# Patient Record
Sex: Male | Born: 1980 | Race: White | Hispanic: No | Marital: Married | Smoking: Never smoker
Health system: Southern US, Community
[De-identification: ages and names within clinical notes are randomized; demographics above are authoritative.]

## PROBLEM LIST (undated history)

## (undated) DIAGNOSIS — J45909 Unspecified asthma, uncomplicated: Secondary | ICD-10-CM

## (undated) HISTORY — PX: OTHER SURGICAL HISTORY: SHX169

## (undated) HISTORY — PX: TONSILLECTOMY: SUR1361

---

## 2009-11-02 ENCOUNTER — Ambulatory Visit: Payer: Self-pay | Admitting: Family Medicine

## 2009-11-02 DIAGNOSIS — J45909 Unspecified asthma, uncomplicated: Secondary | ICD-10-CM | POA: Insufficient documentation

## 2009-11-02 DIAGNOSIS — M26629 Arthralgia of temporomandibular joint, unspecified side: Secondary | ICD-10-CM

## 2009-11-04 ENCOUNTER — Encounter: Payer: Self-pay | Admitting: Family Medicine

## 2010-09-02 NOTE — Letter (Signed)
Summary: Internal Correspondence  Internal Correspondence   Imported By: Dannette Barbara 11/04/2009 11:09:46  _____________________________________________________________________  External Attachment:    Type:   Image     Comment:   External Document

## 2010-09-02 NOTE — Assessment & Plan Note (Signed)
Summary: Ear pain- Both x 1wk rm 3   Vital Signs:  Patient Profile:   30 Years Old Male CC:      Cold & URI symptoms Height:     72 inches Weight:      234 pounds O2 Sat:      100 % O2 treatment:    Room Air Temp:     96.6 degrees F oral Pulse rate:   75 / minute Pulse rhythm:   regular Resp:     16 per minute BP sitting:   142 / 86  (right arm) Cuff size:   regular  Vitals Entered By: Areta Haber CMA (November 02, 2009 5:00 PM)                  Current Allergies: No known allergies History of Present Illness Chief Complaint: Cold & URI symptoms History of Present Illness: Subjective:  Patient complains of two history of vague right earache, and now the left.  He notes that he has had some sinus congestion and ? sore throat.  No fever. No cough.  He denies grinding his teeth.  No teeth pain  Current Problems: TMJ PAIN (ICD-524.62) ASTHMA (ICD-493.90)   Current Meds CLARITIN 10 MG TABS (LORATADINE) 1 tab by mouth once daily NAPROXEN 500 MG TABS (NAPROXEN) One by mouth two times a day pc CYCLOBENZAPRINE HCL 10 MG TABS (CYCLOBENZAPRINE HCL) One tab by mouth HS  REVIEW OF SYSTEMS Constitutional Symptoms      Denies fever, chills, night sweats, weight loss, weight gain, and fatigue.  Eyes       Denies change in vision, eye pain, eye discharge, glasses, contact lenses, and eye surgery. Ear/Nose/Throat/Mouth       Complains of ear pain.      Denies hearing loss/aids, change in hearing, ear discharge, dizziness, frequent runny nose, frequent nose bleeds, sinus problems, sore throat, hoarseness, and tooth pain or bleeding.      Comments: Both x 1 wk Respiratory       Denies dry cough, productive cough, wheezing, shortness of breath, asthma, bronchitis, and emphysema/COPD.  Cardiovascular       Denies murmurs, chest pain, and tires easily with exhertion.    Gastrointestinal       Denies stomach pain, nausea/vomiting, diarrhea, constipation, blood in bowel movements, and  indigestion. Genitourniary       Denies painful urination, kidney stones, and loss of urinary control. Neurological       Denies paralysis, seizures, and fainting/blackouts. Musculoskeletal       Denies muscle pain, joint pain, joint stiffness, decreased range of motion, redness, swelling, muscle weakness, and gout.  Skin       Denies bruising, unusual mles/lumps or sores, and hair/skin or nail changes.  Psych       Denies mood changes, temper/anger issues, anxiety/stress, speech problems, depression, and sleep problems. Other Comments: Pt has not seen PCP for this. Pt states that he has been out of the country.   Past History:  Past Medical History: Asthma  Past Surgical History: Denies surgical history  Social History: Married Never Smoked Alcohol use-no Drug use-no Regular exercise-no Smoking Status:  never Drug Use:  no Does Patient Exercise:  no   Objective:  No acute distress  Eyes:  Pupils are equal, round, and reactive to light and accomdation.  Extraocular movement is intact.  Conjunctivae are not inflamed.  Ears:  Canals normal.  Tympanic membranes normal.   There is distinct tenderness over  both TMJ's worse on the right. Nose:  Minimal congestion.  No sinus tenderness Mouth:  Normal Pharynx:  Normal  Neck:  Supple.  No adenopathy is present.  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  Begin jaw exercises.  Apply ice pack 2 or 3 times daily.  Given [employer] on TMJ problems Follow-up with ENT if not improving.     Diagnoses and expected course of recovery discussed and will return if not improved as expected or if the condition worsens. Patient and/or caregiver verbalized understanding.  Prescriptions: CYCLOBENZAPRINE HCL 10 MG TABS (CYCLOBENZAPRINE HCL) One tab by mouth HS  #15 x 1   Entered and Authorized by:   Donna Christen MD   Signed by:   Donna Christen MD on 11/02/2009   Method used:   Print then Give to Patient   RxID:   [id number] NAPROXEN 500 MG TABS (NAPROXEN) One by mouth two times a day pc  #30 x 1   Entered and Authorized by:   Donna Christen MD   Signed by:   Donna Christen MD on 11/02/2009   Method used:   Print then Give to Patient   RxID:   [id number]

## 2014-04-01 ENCOUNTER — Emergency Department (HOSPITAL_BASED_OUTPATIENT_CLINIC_OR_DEPARTMENT_OTHER)
Admission: EM | Admit: 2014-04-01 | Discharge: 2014-04-02 | Disposition: A | Discharge status: Home / Self Care | Payer: BC Managed Care – PPO | Attending: Emergency Medicine | Admitting: Emergency Medicine

## 2014-04-01 ENCOUNTER — Emergency Department (HOSPITAL_BASED_OUTPATIENT_CLINIC_OR_DEPARTMENT_OTHER): Payer: BC Managed Care – PPO

## 2014-04-01 ENCOUNTER — Encounter (HOSPITAL_BASED_OUTPATIENT_CLINIC_OR_DEPARTMENT_OTHER): Payer: Self-pay | Admitting: Emergency Medicine

## 2014-04-01 DIAGNOSIS — Y9389 Activity, other specified: Secondary | ICD-10-CM | POA: Diagnosis not present

## 2014-04-01 DIAGNOSIS — S60229A Contusion of unspecified hand, initial encounter: Secondary | ICD-10-CM | POA: Insufficient documentation

## 2014-04-01 DIAGNOSIS — Y929 Unspecified place or not applicable: Secondary | ICD-10-CM | POA: Insufficient documentation

## 2014-04-01 DIAGNOSIS — IMO0002 Reserved for concepts with insufficient information to code with codable children: Secondary | ICD-10-CM | POA: Diagnosis not present

## 2014-04-01 DIAGNOSIS — S6000XA Contusion of unspecified finger without damage to nail, initial encounter: Secondary | ICD-10-CM

## 2014-04-01 DIAGNOSIS — J45909 Unspecified asthma, uncomplicated: Secondary | ICD-10-CM | POA: Diagnosis not present

## 2014-04-01 DIAGNOSIS — S6990XA Unspecified injury of unspecified wrist, hand and finger(s), initial encounter: Secondary | ICD-10-CM | POA: Diagnosis present

## 2014-04-01 DIAGNOSIS — S60222A Contusion of left hand, initial encounter: Secondary | ICD-10-CM

## 2014-04-01 HISTORY — DX: Unspecified asthma, uncomplicated: J45.909

## 2014-04-01 MED ORDER — IBUPROFEN 800 MG PO TABS
800.0000 mg | ORAL_TABLET | Freq: Once | ORAL | Status: AC
  Administered 2014-04-01: 800 mg via ORAL
  Filled 2014-04-01: qty 1

## 2014-04-01 MED ORDER — IBUPROFEN 800 MG PO TABS: 800.0000 mg | ORAL_TABLET | Freq: Three times a day (TID) | ORAL | Status: DC

## 2014-04-01 MED ORDER — HYDROCODONE-ACETAMINOPHEN 5-325 MG PO TABS: 2.0000 | ORAL_TABLET | ORAL | Status: DC | PRN

## 2014-04-01 MED ORDER — HYDROCODONE-ACETAMINOPHEN 5-325 MG PO TABS
2.0000 | ORAL_TABLET | Freq: Once | ORAL | Status: AC
  Administered 2014-04-01: 2 via ORAL
  Filled 2014-04-01: qty 2

## 2014-04-01 NOTE — ED Notes (Signed)
Pt reports left hand pain that started today while cutting wood.

## 2014-04-01 NOTE — ED Provider Notes (Signed)
CSN: [id number]     Arrival date & time 04/01/14  2201 History   None    Chief Complaint  Patient presents with  . Hand Pain     (Consider location/radiation/quality/duration/timing/severity/associated sxs/prior Treatment) Patient is a 33 y.o. male presenting with hand pain. The history is provided by the patient. No language interpreter was used.  Hand Pain This is a new problem. The current episode started today. The problem occurs constantly. The problem has been gradually worsening. Associated symptoms include myalgias. Nothing aggravates the symptoms. He has tried nothing for the symptoms. The treatment provided moderate relief.  Pt complains of swelling in left hand, thumb, index and middle finger.  Pt reports hit by a piece of board while cutting wood  Past Medical History  Diagnosis Date  . Asthma    Past Surgical History  Procedure Laterality Date  . Left knee surgery    . Tonsillectomy     History reviewed. No pertinent family history. History  Substance Use Topics  . Smoking status: Never Smoker   . Smokeless tobacco: Not on file  . Alcohol Use: Yes     Comment: rare    Review of Systems  Musculoskeletal: Positive for myalgias.  All other systems reviewed and are negative.     Allergies  Review of patient's allergies indicates no known allergies.  Home Medications   Prior to Admission medications   Not on File   BP 161/100  Pulse 72  Temp(Src) 97.9 F (36.6 C) (Oral)  Resp 21  Ht 6' (1.829 m)  Wt 215 lb (97.523 kg)  BMI 29.15 kg/m2  SpO2 100% Physical Exam  Nursing note and vitals reviewed. Constitutional: He is oriented to person, place, and time. He appears well-developed and well-nourished.  HENT:  Head: Normocephalic.  Eyes: EOM are normal.  Neck: Normal range of motion.  Abdominal: He exhibits no distension.  Musculoskeletal: He exhibits tenderness.  Swollen hand, 1st, 2nd and 3rd finger.  Pain with range of motion  nv and ns intact   Neurological: He is alert and oriented to person, place, and time.  Psychiatric: He has a normal mood and affect.    ED Course  Procedures (including critical care time) Labs Review Labs Reviewed - No data to display  Imaging Review Dg Hand Complete Left  04/01/2014   CLINICAL DATA:  Left hand pain  EXAM: LEFT HAND - COMPLETE 3+ VIEW  COMPARISON:  None.  FINDINGS: No displaced fracture or dislocation. Mild hyperextension and ulnar angulation at the third DIP. No aggressive osseous lesion or degenerative change. No radiopaque foreign body.  IMPRESSION: Mild hyperextension at the third DIP is likely positional. Correlate with range of motion.  Otherwise, no acute Or aggressive osseous finding of the left hand.   Electronically Signed   By: Jearld Lesch M.D.   On: 04/01/2014 22:38     EKG Interpretation None      MDM   Final diagnoses:  Contusion of left hand, initial encounter  Contusion, fingers, initial encounter    Splint Ice, elevate Schedule to see Dr. Janee Morn for recheck this week.    Lonia Skinner [place], PA-C 04/01/14 2348

## 2014-04-01 NOTE — Discharge Instructions (Signed)
Contusion °A contusion is a deep bruise. Contusions are the result of an injury that caused bleeding under the skin. The contusion may turn blue, purple, or yellow. Minor injuries will give you a painless contusion, but more severe contusions may stay painful and swollen for a few weeks.  °CAUSES  °A contusion is usually caused by a blow, trauma, or direct force to an area of the body. °SYMPTOMS  °· Swelling and redness of the injured area. °· Bruising of the injured area. °· Tenderness and soreness of the injured area. °· Pain. °DIAGNOSIS  °The diagnosis can be made by taking a history and physical exam. An X-ray, CT scan, or MRI may be needed to determine if there were any associated injuries, such as fractures. °TREATMENT  °Specific treatment will depend on what area of the body was injured. In general, the best treatment for a contusion is resting, icing, elevating, and applying cold compresses to the injured area. Over-the-counter medicines may also be recommended for pain control. Ask your caregiver what the best treatment is for your contusion. °HOME CARE INSTRUCTIONS  °· Put ice on the injured area. °¨ Put ice in a plastic bag. °¨ Place a towel between your skin and the bag. °¨ Leave the ice on for 15-20 minutes, 3-4 times a day, or as directed by your health care provider. °· Only take over-the-counter or prescription medicines for pain, discomfort, or fever as directed by your caregiver. Your caregiver may recommend avoiding anti-inflammatory medicines (aspirin, ibuprofen, and naproxen) for 48 hours because these medicines may increase bruising. °· Rest the injured area. °· If possible, elevate the injured area to reduce swelling. °SEEK IMMEDIATE MEDICAL CARE IF:  °· You have increased bruising or swelling. °· You have pain that is getting worse. °· Your swelling or pain is not relieved with medicines. °MAKE SURE YOU:  °· Understand these instructions. °· Will watch your condition. °· Will get help right  away if you are not doing well or get worse. °Document Released: 04/29/2005 Document Revised: 07/25/2013 Document Reviewed: 05/25/2011 °[employer]® Patient Information ©2015 [employer]. This information is not intended to replace advice given to you by your health care provider. Make sure you discuss any questions you have with your health care provider. ° °

## 2014-04-02 DIAGNOSIS — S60229A Contusion of unspecified hand, initial encounter: Secondary | ICD-10-CM | POA: Diagnosis not present

## 2014-04-02 NOTE — ED Provider Notes (Signed)
Medical screening examination/treatment/procedure(s) were performed by non-physician practitioner and as supervising physician I was immediately available for consultation/collaboration.   EKG Interpretation None        Hanley Seamen, MD 04/02/14 [phone]

## 2015-09-05 IMAGING — CR DG HAND COMPLETE 3+V*L*
3 series · 3 of 3 positions shown · non-contrast
Comparison: None.

CLINICAL DATA: Left hand pain

EXAM:
LEFT HAND - COMPLETE 3+ VIEW

[x hand pa left]
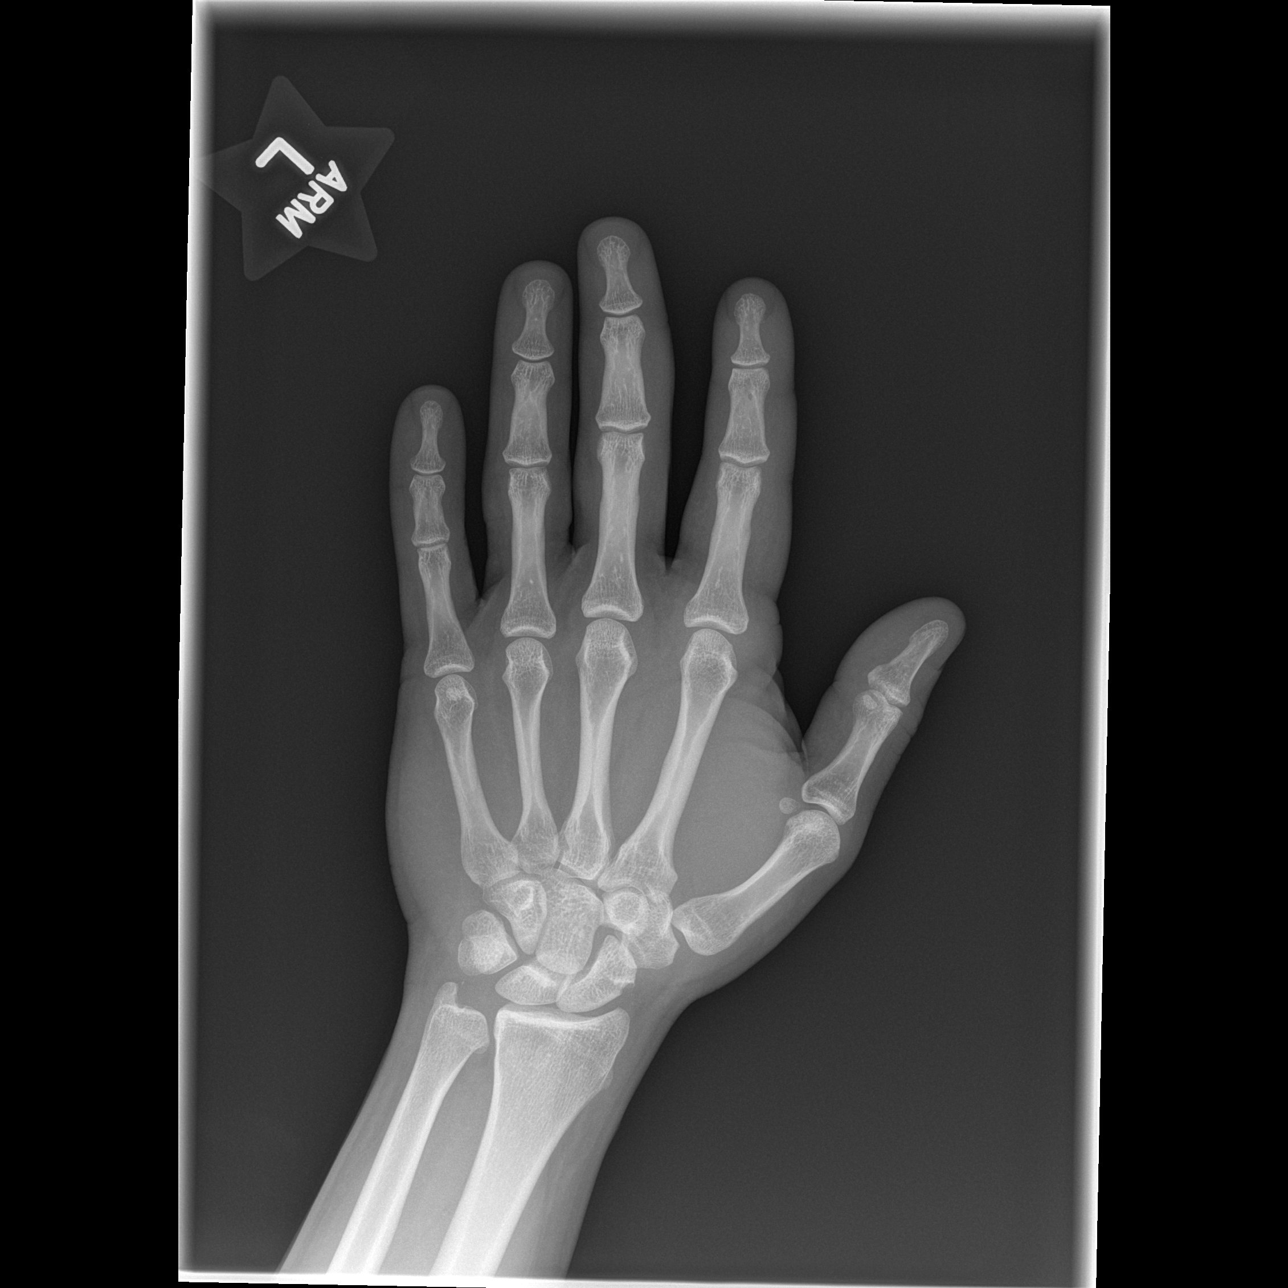

[x hand oblique left]
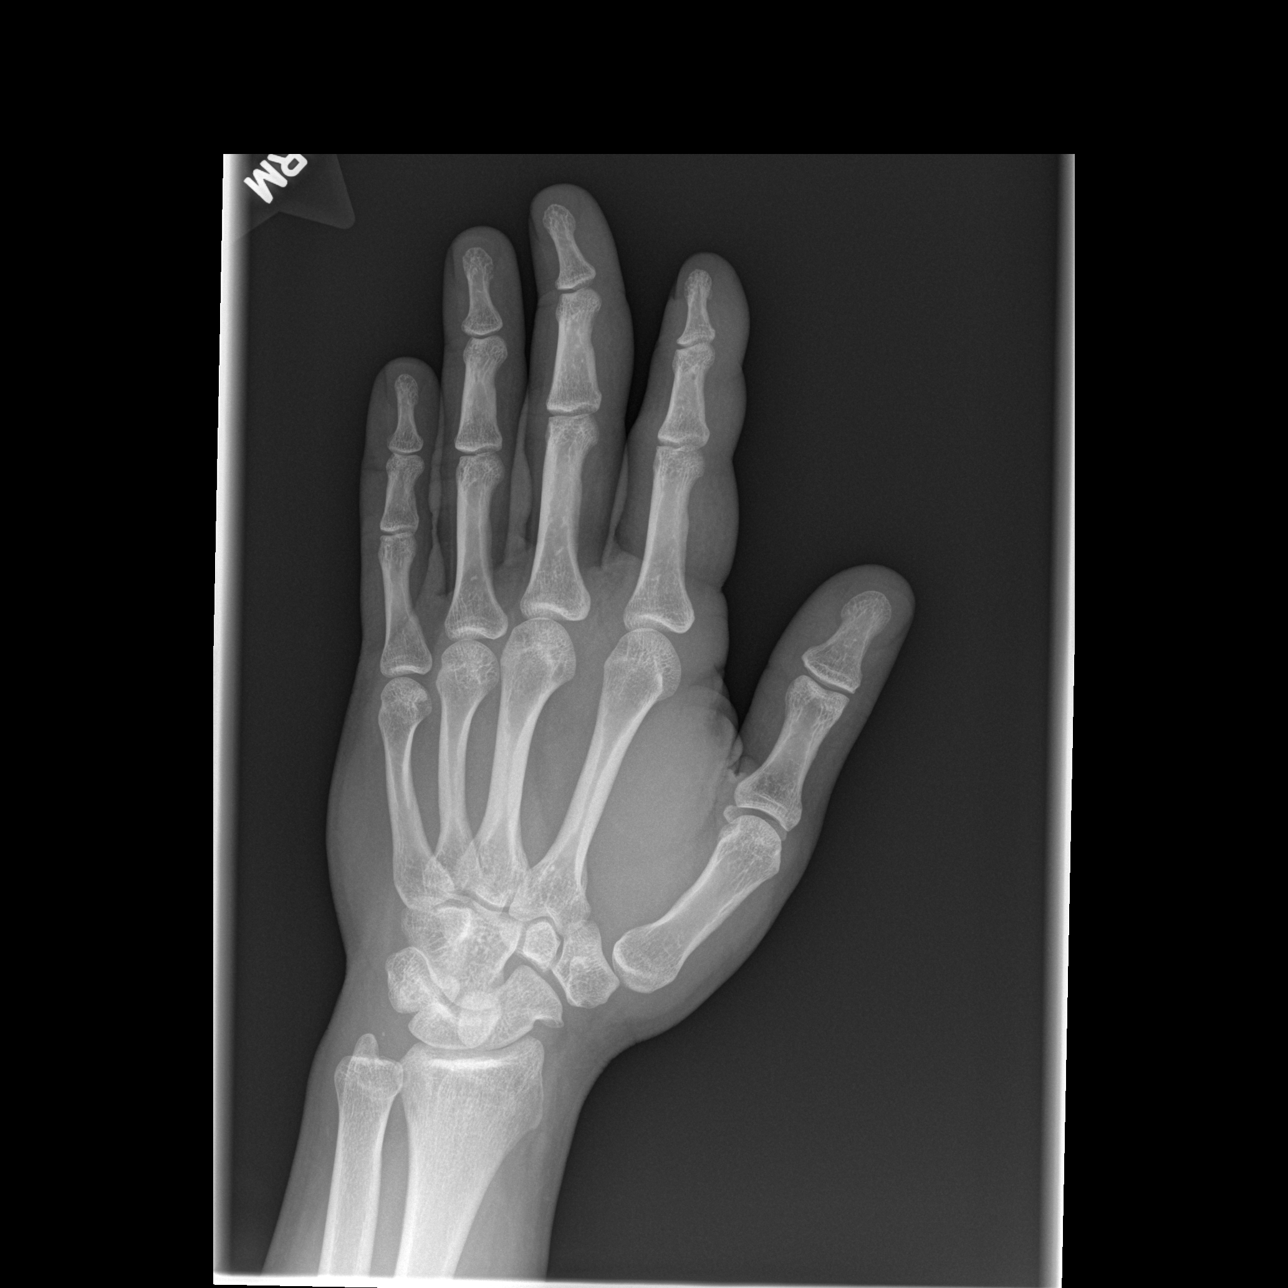

[x hand lat left]
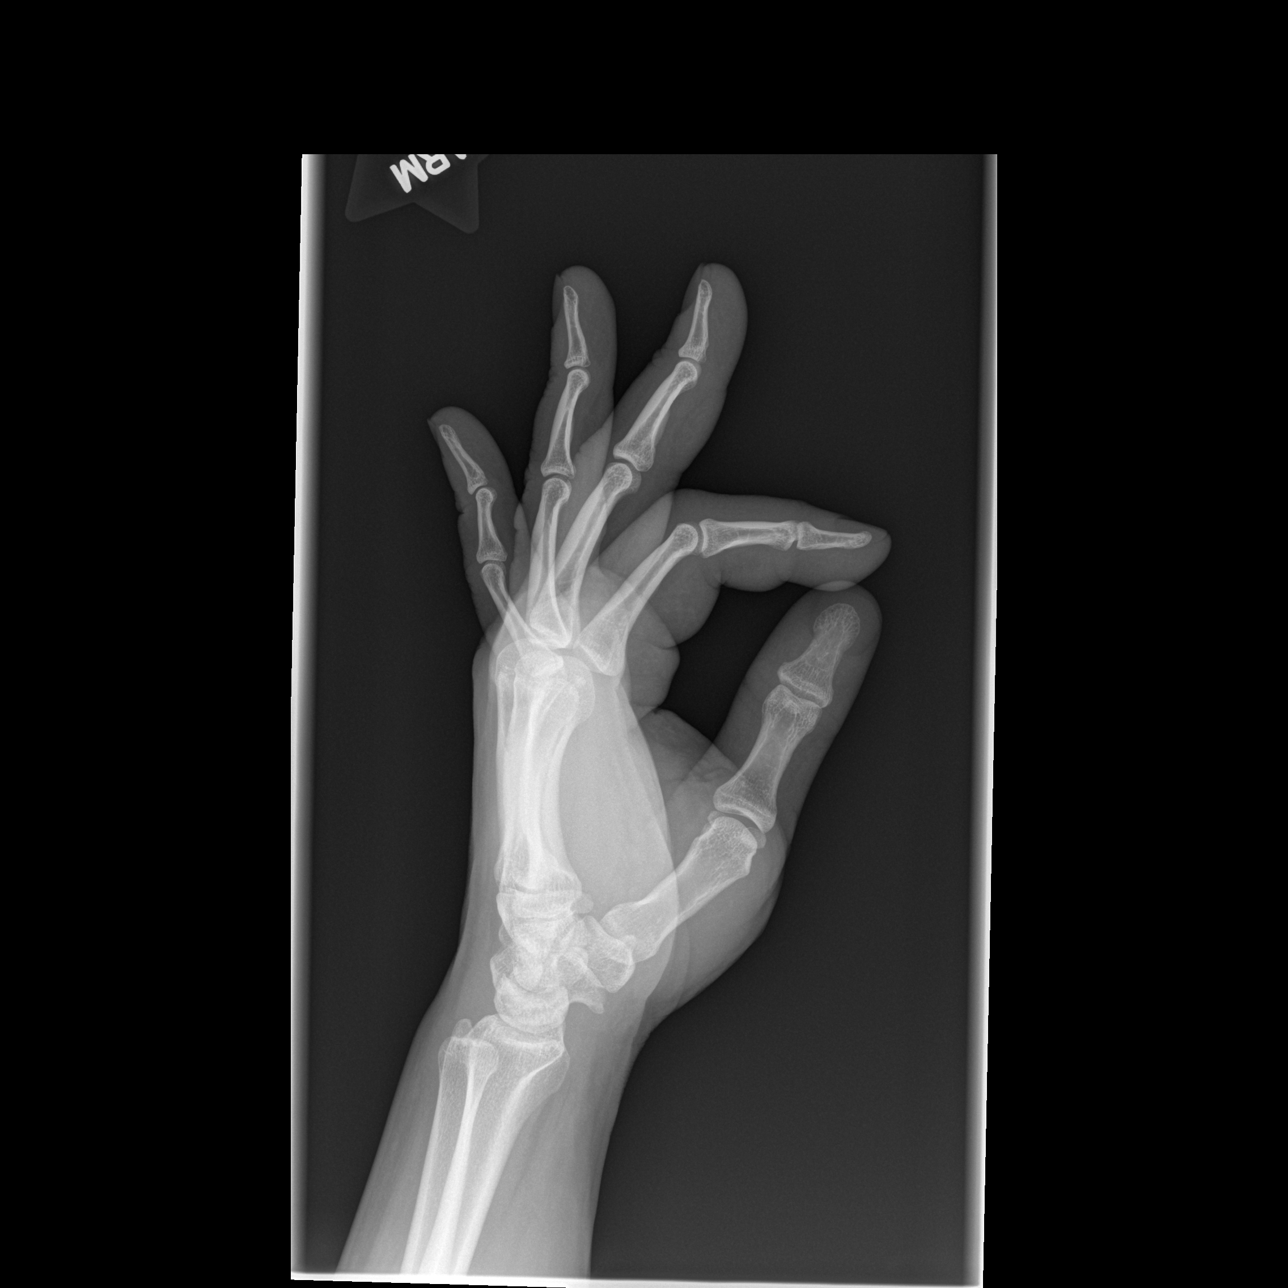

[3 of 3 positions shown; findings below may reference images not displayed]

FINDINGS: No displaced fracture or dislocation. Mild hyperextension and ulnar
angulation at the third DIP. No aggressive osseous lesion or
degenerative change. No radiopaque foreign body.
IMPRESSION: Mild hyperextension at the third DIP is likely positional. Correlate
with range of motion.

Otherwise, no acute Or aggressive osseous finding of the left hand.

## 2016-03-16 ENCOUNTER — Emergency Department (HOSPITAL_BASED_OUTPATIENT_CLINIC_OR_DEPARTMENT_OTHER): Payer: BLUE CROSS/BLUE SHIELD

## 2016-03-16 ENCOUNTER — Encounter (HOSPITAL_BASED_OUTPATIENT_CLINIC_OR_DEPARTMENT_OTHER): Payer: Self-pay | Admitting: *Deleted

## 2016-03-16 ENCOUNTER — Emergency Department (HOSPITAL_BASED_OUTPATIENT_CLINIC_OR_DEPARTMENT_OTHER)
Admission: EM | Admit: 2016-03-16 | Discharge: 2016-03-16 | Disposition: A | Discharge status: Home / Self Care | Payer: BLUE CROSS/BLUE SHIELD | Attending: Emergency Medicine | Admitting: Emergency Medicine

## 2016-03-16 DIAGNOSIS — Y92009 Unspecified place in unspecified non-institutional (private) residence as the place of occurrence of the external cause: Secondary | ICD-10-CM | POA: Insufficient documentation

## 2016-03-16 DIAGNOSIS — T18108A Unspecified foreign body in esophagus causing other injury, initial encounter: Secondary | ICD-10-CM | POA: Insufficient documentation

## 2016-03-16 DIAGNOSIS — Y999 Unspecified external cause status: Secondary | ICD-10-CM | POA: Diagnosis not present

## 2016-03-16 DIAGNOSIS — X58XXXA Exposure to other specified factors, initial encounter: Secondary | ICD-10-CM | POA: Insufficient documentation

## 2016-03-16 DIAGNOSIS — T17900A Unspecified foreign body in respiratory tract, part unspecified causing asphyxiation, initial encounter: Secondary | ICD-10-CM

## 2016-03-16 DIAGNOSIS — Y939 Activity, unspecified: Secondary | ICD-10-CM | POA: Insufficient documentation

## 2016-03-16 DIAGNOSIS — J45909 Unspecified asthma, uncomplicated: Secondary | ICD-10-CM | POA: Insufficient documentation

## 2016-03-16 DIAGNOSIS — Z79899 Other long term (current) drug therapy: Secondary | ICD-10-CM | POA: Diagnosis not present

## 2016-03-16 DIAGNOSIS — J029 Acute pharyngitis, unspecified: Secondary | ICD-10-CM | POA: Diagnosis not present

## 2016-03-16 DIAGNOSIS — T17908A Unspecified foreign body in respiratory tract, part unspecified causing other injury, initial encounter: Secondary | ICD-10-CM

## 2016-03-16 NOTE — ED Triage Notes (Signed)
States he was building a structure with his hands above his head and a screw in his mouth. He swallowed the screw. He can feel it stuck in his esophagus.

## 2016-03-16 NOTE — ED Notes (Signed)
Patient transported to X-ray 

## 2016-03-16 NOTE — ED Provider Notes (Signed)
[facility]   CSN: [id number]   By signing my name below, I, Phillis HaggisGabriella Gaje, attest that this documentation has been prepared under the direction and in the presence of Fayrene HelperBowie Ceciley Buist, PA-C. Electronically Signed: Phillis HaggisGabriella Gaje, ED Scribe. 03/16/16. 9:38 PM.  Arrival date & time: 03/16/16  1957  History   Chief Complaint Chief Complaint  Patient presents with  . Swallowed Foreign Body   The history is provided by the patient. No language interpreter was used.  HPI Comments: James Mejia is a 35 y.o. male who presents to the Emergency Department complaining of a swallowed foreign body onset 3 hours ago. Pt reports that he had three screws being held in his mouth while working on a house. He states that he may have swallowed a 2 cm aluminum screw on accident. He reports associated throat discomfort. He has tried making himself throw up to visualize the foreign body, but did not bring anything up. Denies any abnormal bleeding. Denies cp, sob. He denies abdominal pain, nausea, melena, or hematochezia. He is unsure when his last tdap was.  Past Medical History:  Diagnosis Date  . Asthma     Patient Active Problem List   Diagnosis Date Noted  . ASTHMA 11/02/2009  . TMJ PAIN 11/02/2009    Past Surgical History:  Procedure Laterality Date  . left knee surgery    . TONSILLECTOMY        Home Medications    Prior to Admission medications   Medication Sig Start Date End Date Taking? Authorizing Provider  Cetirizine HCl (ZYRTEC PO) Take by mouth.   Yes Historical Provider, MD  Montelukast Sodium (SINGULAIR PO) Take by mouth.   Yes Historical Provider, MD  HYDROcodone-acetaminophen (NORCO/VICODIN) 5-325 MG per tablet Take 2 tablets by mouth every 4 (four) hours as needed. 04/01/14   Elson AreasLeslie K Sofia, PA-C  ibuprofen (ADVIL,MOTRIN) 800 MG tablet Take 1 tablet (800 mg total) by mouth 3 (three) times daily. 04/01/14   Elson AreasLeslie K Sofia, PA-C    Family History No  family history on file.  Social History Social History  Substance Use Topics  . Smoking status: Never Smoker  . Smokeless tobacco: Never Used  . Alcohol use Yes     Comment: rare     Allergies   Review of patient's allergies indicates no known allergies.   Review of Systems Review of Systems  HENT: Positive for sore throat.   Gastrointestinal: Negative for abdominal pain, blood in stool and nausea.  All other systems reviewed and are negative.    Physical Exam Updated Vital Signs BP 120/86   Pulse 88   Temp 98.1 F (36.7 C) (Oral)   Resp 16   Ht 6' (1.829 m)   Wt 230 lb (104.3 kg)   SpO2 98%   BMI 31.19 kg/m   Physical Exam  Constitutional: He is oriented to person, place, and time. He appears well-developed and well-nourished.  HENT:  Head: Normocephalic and atraumatic.  Mouth/Throat: Oropharynx is clear and moist. No oropharyngeal exudate.  Eyes: Conjunctivae and EOM are normal. Pupils are equal, round, and reactive to light.  Neck: Normal range of motion. Neck supple.  Cardiovascular: Normal rate and regular rhythm.   Pulmonary/Chest: Effort normal and breath sounds normal.  Abdominal: Soft. There is no tenderness.  Musculoskeletal: Normal range of motion.  Neurological: He is alert and oriented to person, place, and time.  Skin: Skin is warm and dry.  Psychiatric: He has a normal mood and  affect. His behavior is normal.  Nursing note and vitals reviewed.    ED Treatments / Results  DIAGNOSTIC STUDIES: Oxygen Saturation is 98% on RA, normal by my interpretation.    COORDINATION OF CARE: 9:35 PM-Discussed treatment plan which includes follow up if symptoms worsen with pt at bedside and pt agreed to plan.    Labs (all labs ordered are listed, but only abnormal results are displayed) Labs Reviewed - No data to display  EKG  EKG Interpretation None       Radiology Dg Neck Soft Tissue  Result Date: 03/16/2016 CLINICAL DATA:  Swallowed dry  wall screw today.  Initial encounter. EXAM: NECK SOFT TISSUES - 1+ VIEW COMPARISON:  None. FINDINGS: The nasopharynx, oropharynx and hypopharynx are unremarkable in appearance. Prevertebral soft tissues are within normal limits. The proximal trachea is unremarkable. No radiopaque foreign body is seen. The visualized osseous structures are grossly unremarkable. The visualized lung apices are grossly clear. The paranasal sinuses and mastoid air cells are well-aerated. IMPRESSION: No radiopaque foreign body seen. Unremarkable radiographs of the soft tissues of the neck. Electronically Signed   By: Roanna RaiderJeffery  Chang M.D.   On: 03/16/2016 21:13    Procedures Procedures (including critical care time)  Medications Ordered in ED Medications - No data to display   Initial Impression / Assessment and Plan / ED Course  I have reviewed the triage vital signs and the nursing notes.  Pertinent labs & imaging results that were available during my care of the patient were reviewed by me and considered in my medical decision making (see chart for details).  Clinical Course    BP 120/86   Pulse 88   Temp 98.1 F (36.7 C) (Oral)   Resp 16   Ht 6' (1.829 m)   Wt 104.3 kg   SpO2 98%   BMI 31.19 kg/m    Final Clinical Impressions(s) / ED Diagnoses   MDM: Pt s/p accidentally swallowing 2 cm aluminum screw. He reports mild throat discomfort but is otherwise in NAD.X-ray does not reveal foreign body in esophagus or soft tissue of neck. Pt will be given appropriate follow up should he begin to experience new or worsening symptoms. Advised pt that he may most likely pass the foreign body in a BM without difficulty. Pt is agreeable to the above plan and is safe for discharge.   Final diagnoses:  FB esophagus   I personally performed the services described in this documentation, which was scribed in my presence. The recorded information has been reviewed and is accurate.      New Prescriptions New  Prescriptions   No medications on file     Fayrene HelperBowie Shynice Sigel, Cordelia Poche-C 03/16/16 2141    Loren Raceravid [place], MD 03/21/16 [phone]

## 2017-08-20 IMAGING — CR DG NECK SOFT TISSUE
2 series · 2 of 2 positions shown · non-contrast
Comparison: None.

CLINICAL DATA: Swallowed dry wall screw today.  Initial encounter.

EXAM:
NECK SOFT TISSUES - 1+ VIEW

[w soft tissue neck *]
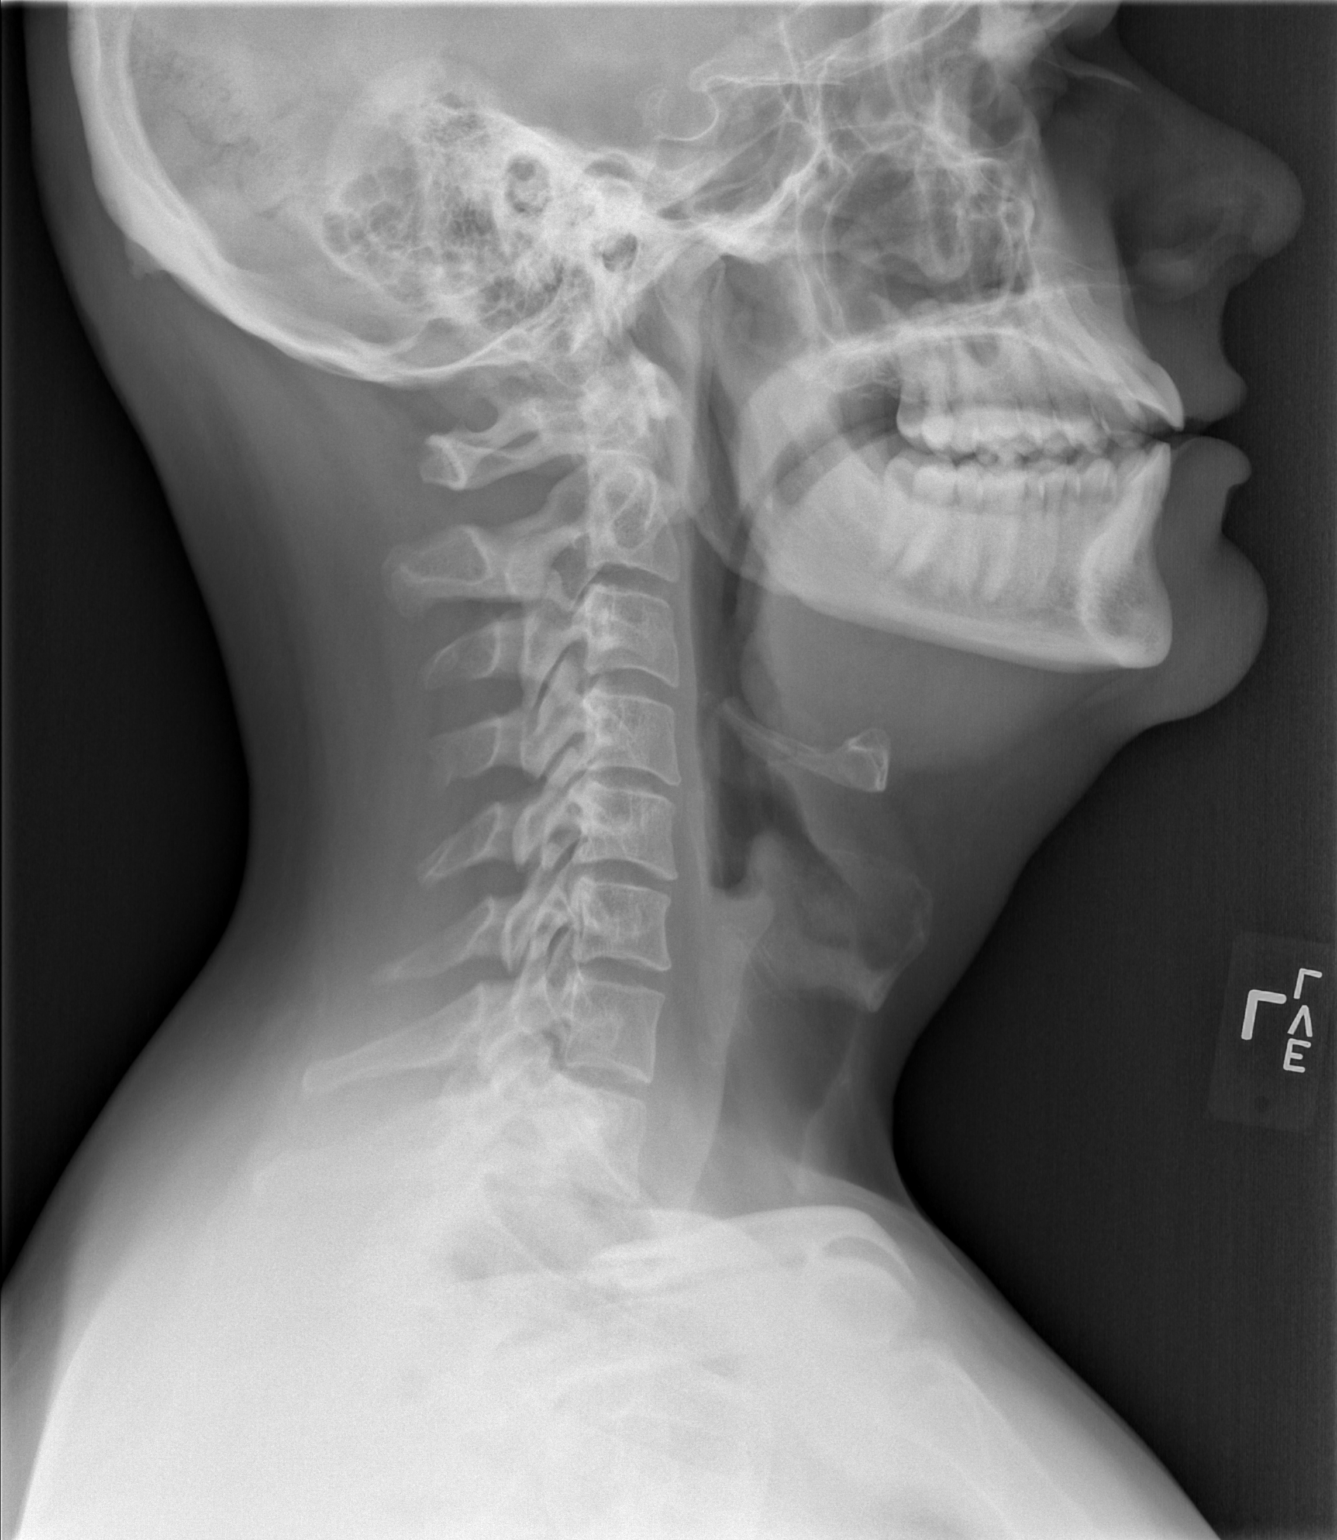

[w soft tissue neck ap]
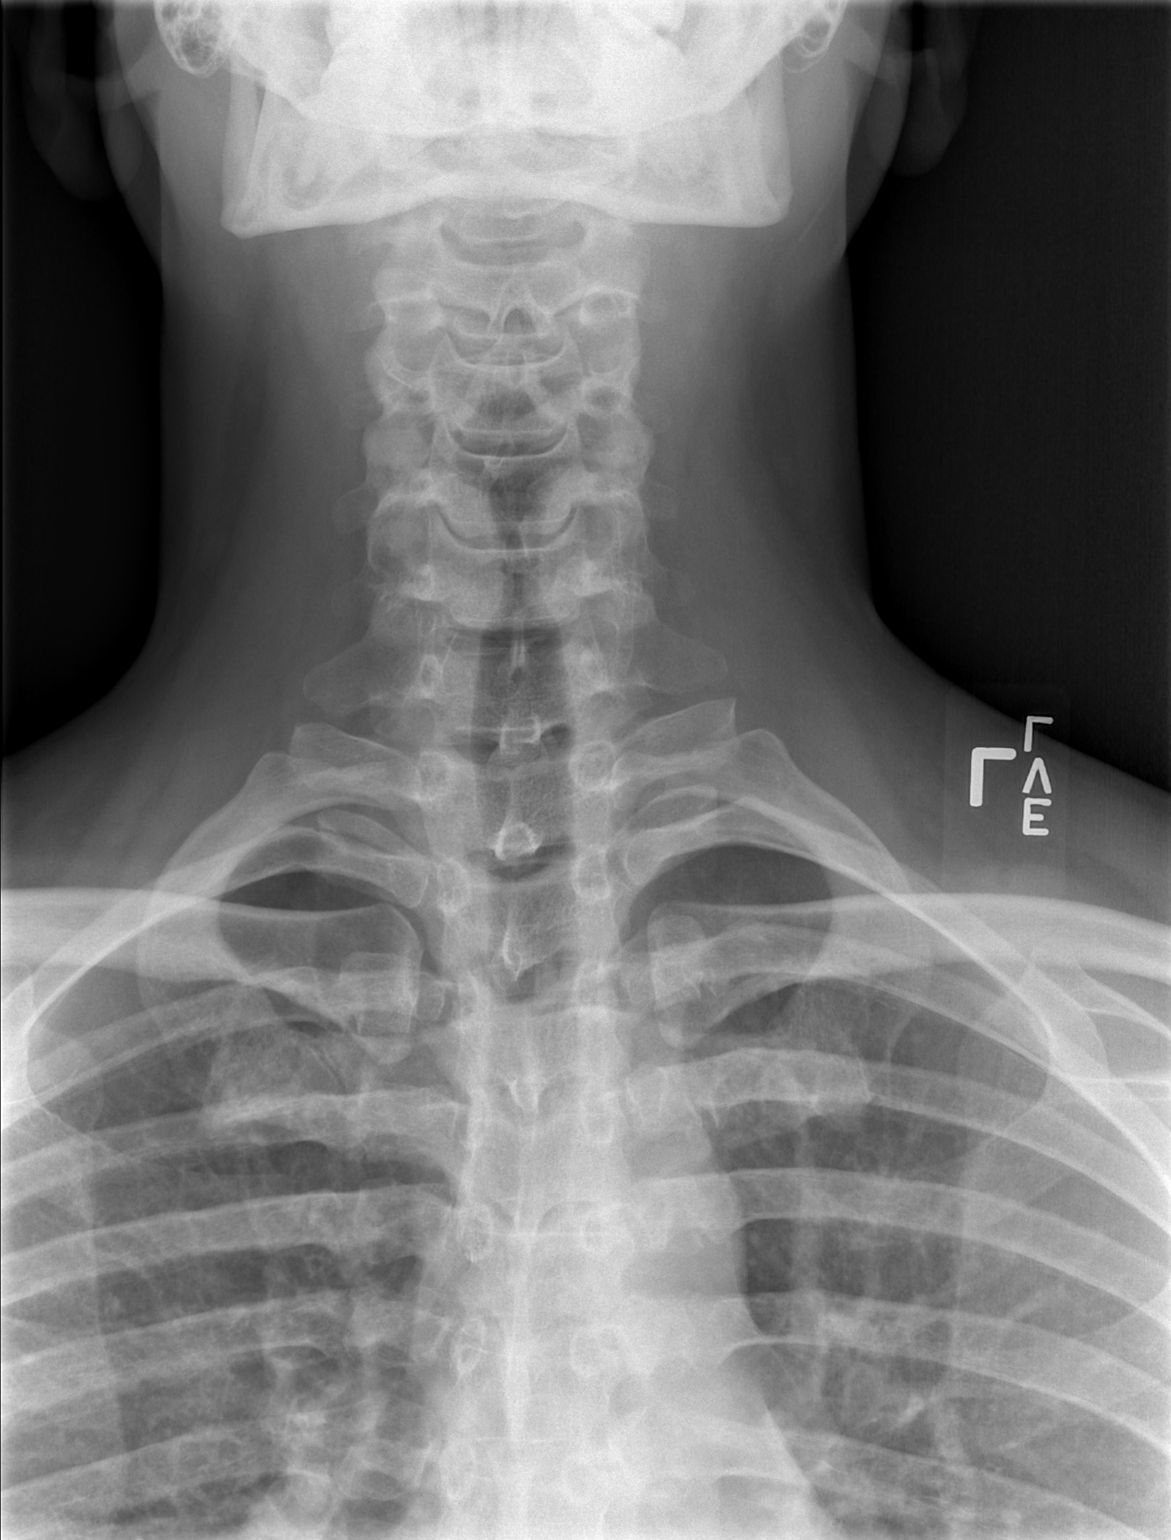

[2 of 2 positions shown; findings below may reference images not displayed]

FINDINGS: The nasopharynx, oropharynx and hypopharynx are unremarkable in
appearance. Prevertebral soft tissues are within normal limits. The
proximal trachea is unremarkable. No radiopaque foreign body is
seen.

The visualized osseous structures are grossly unremarkable. The
visualized lung apices are grossly clear. The paranasal sinuses and
mastoid air cells are well-aerated.
IMPRESSION: No radiopaque foreign body seen. Unremarkable radiographs of the
soft tissues of the neck.

## 2022-01-12 DIAGNOSIS — H66003 Acute suppurative otitis media without spontaneous rupture of ear drum, bilateral: Secondary | ICD-10-CM | POA: Diagnosis not present

## 2022-03-19 DIAGNOSIS — H9313 Tinnitus, bilateral: Secondary | ICD-10-CM | POA: Diagnosis not present

## 2022-05-29 DIAGNOSIS — H66002 Acute suppurative otitis media without spontaneous rupture of ear drum, left ear: Secondary | ICD-10-CM | POA: Diagnosis not present

## 2022-05-29 DIAGNOSIS — Z6829 Body mass index (BMI) 29.0-29.9, adult: Secondary | ICD-10-CM | POA: Diagnosis not present

## 2022-05-29 DIAGNOSIS — R509 Fever, unspecified: Secondary | ICD-10-CM | POA: Diagnosis not present

## 2022-05-29 DIAGNOSIS — J0140 Acute pansinusitis, unspecified: Secondary | ICD-10-CM | POA: Diagnosis not present

## 2022-05-29 DIAGNOSIS — Z03818 Encounter for observation for suspected exposure to other biological agents ruled out: Secondary | ICD-10-CM | POA: Diagnosis not present

## 2022-05-29 DIAGNOSIS — J209 Acute bronchitis, unspecified: Secondary | ICD-10-CM | POA: Diagnosis not present

## 2022-05-29 DIAGNOSIS — R051 Acute cough: Secondary | ICD-10-CM | POA: Diagnosis not present

## 2022-09-08 DIAGNOSIS — R030 Elevated blood-pressure reading, without diagnosis of hypertension: Secondary | ICD-10-CM | POA: Diagnosis not present

## 2022-09-08 DIAGNOSIS — R5383 Other fatigue: Secondary | ICD-10-CM | POA: Diagnosis not present

## 2022-09-08 DIAGNOSIS — Z6828 Body mass index (BMI) 28.0-28.9, adult: Secondary | ICD-10-CM | POA: Diagnosis not present

## 2022-09-08 DIAGNOSIS — J4520 Mild intermittent asthma, uncomplicated: Secondary | ICD-10-CM | POA: Diagnosis not present

## 2023-02-13 DIAGNOSIS — R051 Acute cough: Secondary | ICD-10-CM | POA: Diagnosis not present

## 2023-02-21 DIAGNOSIS — H9201 Otalgia, right ear: Secondary | ICD-10-CM | POA: Diagnosis not present

## 2023-02-21 DIAGNOSIS — H6693 Otitis media, unspecified, bilateral: Secondary | ICD-10-CM | POA: Diagnosis not present

## 2023-03-26 ENCOUNTER — Ambulatory Visit (INDEPENDENT_AMBULATORY_CARE_PROVIDER_SITE_OTHER): Payer: Self-pay | Admitting: Sports Medicine

## 2023-03-26 ENCOUNTER — Other Ambulatory Visit (INDEPENDENT_AMBULATORY_CARE_PROVIDER_SITE_OTHER): Payer: BLUE CROSS/BLUE SHIELD

## 2023-03-26 ENCOUNTER — Encounter: Payer: Self-pay | Admitting: Sports Medicine

## 2023-03-26 DIAGNOSIS — G8929 Other chronic pain: Secondary | ICD-10-CM | POA: Diagnosis not present

## 2023-03-26 DIAGNOSIS — M25561 Pain in right knee: Secondary | ICD-10-CM

## 2023-03-26 DIAGNOSIS — M25562 Pain in left knee: Secondary | ICD-10-CM | POA: Diagnosis not present

## 2023-03-26 DIAGNOSIS — S83001S Unspecified subluxation of right patella, sequela: Secondary | ICD-10-CM

## 2023-03-26 DIAGNOSIS — M25362 Other instability, left knee: Secondary | ICD-10-CM

## 2023-03-26 DIAGNOSIS — M25361 Other instability, right knee: Secondary | ICD-10-CM

## 2023-03-26 DIAGNOSIS — M357 Hypermobility syndrome: Secondary | ICD-10-CM

## 2023-03-26 NOTE — Progress Notes (Signed)
James Mejia - 42 y.o. male MRN [id number]  Date of birth: 1980/09/26  Office Visit Note: Visit Date: 03/26/2023 PCP: Patient, No Pcp Per Referred by: No ref. provider found  Subjective: Chief Complaint  Patient presents with   Right Knee - Pain   HPI: James Mejia is a pleasant 42 y.o. male who presents today for right knee pain with patellar subluxation x 2 weeks, bilateral patellar instability.  About 2 weeks ago he had his dog at the back of the knee and feels like his kneecap dislocated.  He fell to the ground and when he extended his leg felt like it popped back in place.  This has not happened for a while, but states the right knee this has happened maybe 3 times in his lifetime.  The left knee has always given him trouble, he reports somewhere around 15 episodes of patellar subluxation.  He did have a meniscal tear with arthroscopic surgery about 20+ years ago but still never fixed his instability.  He uses ibuprofen as needed for pain.  He has not done any formalized physical therapy recently.  Wears a compression knee sleeve for the right knee.  Still some swelling but improved.  Pain is less then weeks prior, currently 2/10.  Pertinent ROS were reviewed with the patient and found to be negative unless otherwise specified above in HPI.   Assessment & Plan: Visit Diagnoses:  1. Patellar instability of both knees   2. Chronic pain of both knees   3. Patellar subluxation, right, sequela   4. Benign joint hypermobility    Plan: Discussed with James Mejia the nature of his bilateral knee instability.  He does have generalized hypermobility with a Beighton score of 6/9.  He has had multiple episodes of patellar subluxation left and right but his right about 2 weeks ago.  He does have a small residual effusion on the right.  Discussed getting him into patellar stabilizing brace, did show him pictures) out for him that he may order over-the-counter or may purchase them here if he wishes.  It  is important to get him into formalized physical therapy to work on strengthening and stabilization of the knee and structures surrounding the patella.  Given his joint hypermobility, surgical options are less likely to be beneficial.  If he is not receiving good benefit from therapy and over-the-counter anti-inflammatories, could consider MRI of the knee.  He will follow-up or notify me in about 2 months to see how he is doing.  Follow-up: Return in about 2 months (around 05/26/2023), or if symptoms worsen or fail to improve.   Meds & Orders: No orders of the defined types were placed in this encounter.   Orders Placed This Encounter  Procedures   XR Knee Complete 4 Views Right   Ambulatory referral to Physical Therapy     Procedures: No procedures performed      Clinical History: No specialty comments available.  He reports that he has never smoked. He has never used smokeless tobacco. No results for input(s): "HGBA1C", "LABURIC" in the last 8760 hours.  Objective:   Vital Signs: There were no vitals taken for this visit.  Physical Exam  Gen: Well-appearing, in no acute distress; non-toxic CV:  Well-perfused. Warm.  Resp: Breathing unlabored on room air; no wheezing. Psych: Fluid speech in conversation; appropriate affect; normal thought process Neuro: Sensation intact throughout. No gross coordination deficits.   Ortho Exam - Bilateral knees: The right knee has a mild to moderate  effusion without warmth or redness.  Left knee without swelling or redness.  Range of motion preserved from 0-135 degrees on the left 0-130 degrees on the right with pain at endrange flexion.  There is no varus or valgus instability.  There is some increased laxity of the patella laterally right greater than left.  Mildly positive apprehension patellar test on the right.  No significant joint laxity.  There is pain with manipulation of the right knee and McMurray's but no clicking noted.  - Beighton  score: 6/9 (b/l pinky, thumb, knees)  Imaging: XR Knee Complete 4 Views Right  Result Date: 03/26/2023 4 views of the right knee including bilateral AP standing, Rosenberg, lateral and sunrise view were ordered and reviewed by myself.  X-rays show well-preserved tibiofemoral joint spaces bilaterally.  Sunrise view shows mild lateral patellar tilt bilaterally.  Lateral view shows some abnormal bony contour on the undersurface of the patella, possibly indicative of prior OCD or underlying chondromalacial changes.  No acute fracture or otherwise acute bony abnormality noted.  Likely small joint effusion noted.   Past Medical/Family/Surgical/Social History: Medications & Allergies reviewed per EMR, new medications updated. Patient Active Problem List   Diagnosis Date Noted   ASTHMA 11/02/2009   TMJ PAIN 11/02/2009   Past Medical History:  Diagnosis Date   Asthma    History reviewed. No pertinent family history. Past Surgical History:  Procedure Laterality Date   left knee surgery     TONSILLECTOMY     Social History   Occupational History   Not on file  Tobacco Use   Smoking status: Never   Smokeless tobacco: Never  Substance and Sexual Activity   Alcohol use: Yes    Comment: rare   Drug use: No   Sexual activity: Not on file

## 2023-03-26 NOTE — Progress Notes (Signed)
York Spaniel it has gone "out of joint a couple times" Said two weeks ago dog hit the back of knee and popped it out Better today but having weakness Medial side knee pain

## 2023-03-30 DIAGNOSIS — S338XXA Sprain of other parts of lumbar spine and pelvis, initial encounter: Secondary | ICD-10-CM | POA: Diagnosis not present

## 2023-03-30 DIAGNOSIS — S134XXA Sprain of ligaments of cervical spine, initial encounter: Secondary | ICD-10-CM | POA: Diagnosis not present

## 2023-03-30 DIAGNOSIS — S233XXA Sprain of ligaments of thoracic spine, initial encounter: Secondary | ICD-10-CM | POA: Diagnosis not present

## 2023-05-10 ENCOUNTER — Ambulatory Visit: Payer: Self-pay | Admitting: Physical Therapy

## 2023-05-17 ENCOUNTER — Encounter: Payer: Self-pay | Admitting: Physical Therapy

## 2023-05-17 ENCOUNTER — Ambulatory Visit (INDEPENDENT_AMBULATORY_CARE_PROVIDER_SITE_OTHER): Payer: Managed Care, Other (non HMO) | Admitting: Physical Therapy

## 2023-05-17 ENCOUNTER — Other Ambulatory Visit: Payer: Self-pay

## 2023-05-17 DIAGNOSIS — M6281 Muscle weakness (generalized): Secondary | ICD-10-CM | POA: Diagnosis not present

## 2023-05-17 DIAGNOSIS — M25562 Pain in left knee: Secondary | ICD-10-CM

## 2023-05-17 DIAGNOSIS — G8929 Other chronic pain: Secondary | ICD-10-CM | POA: Diagnosis not present

## 2023-05-17 DIAGNOSIS — M357 Hypermobility syndrome: Secondary | ICD-10-CM | POA: Diagnosis not present

## 2023-05-17 DIAGNOSIS — M25561 Pain in right knee: Secondary | ICD-10-CM | POA: Diagnosis not present

## 2023-05-17 NOTE — Therapy (Addendum)
OUTPATIENT PHYSICAL THERAPY EVALUATION DISCHARGE SUMMARY   Patient Name: James Mejia MRN: [id number] DOB:18-Feb-1981, 42 y.o., male Today's Date: 05/17/2023  END OF SESSION:  PT End of Session - 05/17/23 1502     Visit Number 1    Number of Visits 6    Date for PT Re-Evaluation 06/28/23    PT Start Time 1510    PT Stop Time 1535    PT Time Calculation (min) 25 min    Activity Tolerance Patient tolerated treatment well    Behavior During Therapy [facility] for tasks assessed/performed             Past Medical History:  Diagnosis Date   Asthma    Past Surgical History:  Procedure Laterality Date   left knee surgery     TONSILLECTOMY     Patient Active Problem List   Diagnosis Date Noted   Asthma 11/02/2009   TMJ PAIN 11/02/2009    PCP: Patient, No Pcp Per  REFERRING PROVIDER: Madelyn Brunner, DO  REFERRING DIAG: M25.561,M25.562,G89.29 (ICD-10-CM) - Chronic pain of both knees   Rationale for Evaluation and Treatment: Rehabilitation  THERAPY DIAG:  Chronic pain of both knees - Plan: PT plan of care cert/re-cert  Muscle weakness (generalized) - Plan: PT plan of care cert/re-cert  Hypermobility syndrome - Plan: PT plan of care cert/re-cert  ONSET DATE: 03/06/23 (approx)   SUBJECTIVE:                                                                                                                                                                                           SUBJECTIVE STATEMENT: Pt presents to OPPT following a patellar subluxation in early August.  He reports it's happened in the past but this was the most painful.  Rt knee still swells occasionally, but does report pain has improved.  He also feels his leg is not as strong as it was.  PERTINENT HISTORY:  Joint hypermobility, hx Lt knee surgery  PAIN:  Are you having pain? No  PRECAUTIONS:  None  RED FLAGS: None   WEIGHT BEARING RESTRICTIONS:  No  FALLS:  Has patient fallen in last 6 months?  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[employer])  PLOF:  Independent and Leisure: watch TV, does exercise daily (weight lifting, cardio, boxing type exercises)  PATIENT GOALS:  Improve swelling and strength   OBJECTIVE:   PATIENT SURVEYS:  05/17/23 FOTO 67 (predicted 81)  COGNITIVE STATUS: Within functional  limits for tasks assessed   SENSATION: WFL   GAIT: 05/17/23 Comments: independent with amb; no deviations identified  EDEMA: 05/17/23 Mild edema present Rt knee lateral/distal patella   LOWER EXTREMITY ROM:     05/17/23 WNL bil knees    LOWER EXTREMITY MMT:    MMT Right eval Left eval  Knee flexion    Knee extension 82.5# 66.5#   (Blank rows = not tested)     TREATMENT:                                                                                                                              DATE:  05/17/23 See HEP - demonstrated exercises and recommendations to limit full range as end range is painful; discussed goal to work on strengthening and stability rather than stretching and preference for single limb strengthening rather than bil LEs to decrease compensations     PATIENT EDUCATION:  Education details: HEP, Person educated: Patient Education method: Programmer, multimedia, Demonstration, and Handouts Education comprehension: verbalized understanding, returned demonstration, and needs further education  HOME EXERCISE PROGRAM: Access Code: [email or web address] URL: [email or web address] Date: 05/17/2023 Prepared by: Moshe Cipro  Exercises - Single Leg Knee Extension with Weight Machine  - 1 x daily - 7 x weekly - 3 sets - 10 reps - Standing Quad Stretch in [employer] Position  - 1 x daily - 7 x weekly - 3 sets - 10 reps - Reverse Lunge  - 1 x daily - 7 x  weekly - 3 sets - 10 reps - Single-Leg [place] Deadlift With Kettlebell  - 1 x daily - 7 x weekly - 3 sets - 10 reps   ASSESSMENT:  CLINICAL IMPRESSION: Patient is a 42 y.o. male who was seen today for physical therapy evaluation and treatment for bil knee pain. He demonstrates strength deficits bil and general hypermobility affecting functional mobility.  He will benefit from PT to address deficits listed.     OBJECTIVE IMPAIRMENTS: decreased strength, increased edema, increased fascial restrictions, and hypermobility .   ACTIVITY LIMITATIONS: standing, squatting, and locomotion level  PARTICIPATION LIMITATIONS: community activity and occupation  PERSONAL FACTORS: 1 comorbidity: joint hypermobility  are also affecting patient's functional outcome.   REHAB POTENTIAL: Good  CLINICAL DECISION MAKING: Stable/uncomplicated  EVALUATION COMPLEXITY: Low   GOALS: Goals reviewed with patient? Yes  SHORT TERM GOALS: Target date: 06/07/2023  Independent with initial HEP Goal status: INITIAL    LONG TERM GOALS: Target date: 06/28/2023  Independent with final HEP Goal status: INITIAL  2.  FOTO score improved to 81 Goal status: INITIAL  3.  Bil knee extension strength improved to at least 15# bil for improved function and mobility Goal status: INIITAL     PLAN:  PT FREQUENCY:  plan to see every 2-3 weeks; can see up to 1x/wk if needed  PT DURATION: 6 weeks  PLANNED INTERVENTIONS: 97164- PT Re-evaluation, 97110-Therapeutic exercises, 97530- Therapeutic activity, [id number]- Neuromuscular re-education, 97535-  Self Care, [address]- Manual therapy, [id number]- Gait training, [id number]- Aquatic Therapy, 97014- Electrical stimulation (unattended), 97016- Vasopneumatic device, [id number]- Ultrasound, Patient/Family education, Balance training, Taping, Dry Needling, Cryotherapy, Moist heat, and 97146- PT Re-evaluation.  PLAN FOR NEXT SESSION: review HEP, recheck strength   NEXT MD VISIT:  05/27/23   Clarita Crane, PT, DPT 05/17/23 3:43 PM     PHYSICAL THERAPY DISCHARGE SUMMARY  Visits from Start of Care: 1  Current functional level related to goals / functional outcomes: See above   Remaining deficits: See above   Education / Equipment: HEP   Patient agrees to discharge. Patient goals were not met. Patient is being discharged due to being pleased with the current functional level.  Clarita Crane, PT, DPT 06/09/23 1:23 PM [facility] [address] [place], [place], [address] Phone: [phone]   Fax:  [phone]

## 2023-05-27 ENCOUNTER — Ambulatory Visit: Payer: BLUE CROSS/BLUE SHIELD | Admitting: Sports Medicine

## 2023-06-09 ENCOUNTER — Encounter: Payer: BLUE CROSS/BLUE SHIELD | Admitting: Physical Therapy

## 2023-06-09 ENCOUNTER — Telehealth: Payer: Self-pay | Admitting: Physical Therapy

## 2023-06-09 NOTE — Telephone Encounter (Signed)
Called pt as he did not show for his PT appt.  Reports he forgot about it.  States he's doing well and feels he doesn't need to return to PT.  Will d/c PT today.

## 2023-07-05 DIAGNOSIS — J309 Allergic rhinitis, unspecified: Secondary | ICD-10-CM | POA: Diagnosis not present

## 2023-07-05 DIAGNOSIS — Z8249 Family history of ischemic heart disease and other diseases of the circulatory system: Secondary | ICD-10-CM | POA: Diagnosis not present

## 2023-07-21 NOTE — Progress Notes (Unsigned)
New Patient Office Visit  Subjective   Patient ID: James Mejia, male    DOB: June 17, 1981  Age: 42 y.o. MRN: [id number]  CC:  Chief Complaint  Patient presents with   Establish Care    HPI Joseff Forseth presents 07/26/2023 to establish care. PMH asthma, TMJ Polidipsia new   presenting with a 20-month history of polydipsia, characterized by increased thirst and a need to drink fluids more frequently than usual. The patient reports drinking significantly more water throughout the day, sometimes feeling excessively thirsty even after drinking. Despite increased fluid intake, they still feel thirsty and have noted frequent urination.  The patient denies any changes in diet or physical activity level, and there are no associated symptoms such as fever, weight loss, or changes in appetite. The patient does not have a history of diabetes but is concerned that these symptoms may be indicative of the condition, especially since they have a family history of diabetes. There is no history of excessive fatigue, blurry vision, or other signs of hyperglycemia.  The patient denies any recent illness, trauma, or medication changes that could explain the increased thirst. They also have not experienced any chest pain, dizziness, or syncope.    Asthma: Patient complains of possible asthma. The patient {has/not:15037} been previously diagnosed with asthma. Symptoms have previously included {asthma symptoms:406::"dyspnea","non-productive cough","wheezing"}. Associated symptoms include {respiratory symptoms:16811}.  Suspected precipitants include {asthma precips:408}.  Symptoms have been {clinical course - history:17::"unchanged"} since their onset. Observed precipitants include {asthma precipitating factors:408}.  Current limitations in activity from asthma include {none:33079}.  Number of days of school or work missed in the last month: {numbers 0-31:32273}. The previous exacerbation occurred {0-10:33138} {time  units w/ wo plural:11} ago. {med adher:16553::"The patient reports adherence to this regimen"}       07/26/2023    3:37 PM  PHQ9 SCORE ONLY  PHQ-9 Total Score 4        07/26/2023    3:36 PM  GAD 7 : Generalized Anxiety Score  Nervous, Anxious, on Edge 1  Control/stop worrying 0  Worry too much - different things 0  Trouble relaxing 0  Restless 0  Easily annoyed or irritable 1  Afraid - awful might happen 0  Total GAD 7 Score 2  Anxiety Difficulty Not difficult at all     Outpatient Encounter Medications as of 07/26/2023  Medication Sig   Cetirizine HCl (ZYRTEC PO) Take by mouth.   [DISCONTINUED] HYDROcodone-acetaminophen (NORCO/VICODIN) 5-325 MG per tablet Take 2 tablets by mouth every 4 (four) hours as needed. (Patient not taking: Reported on 07/26/2023)   [DISCONTINUED] ibuprofen (ADVIL,MOTRIN) 800 MG tablet Take 1 tablet (800 mg total) by mouth 3 (three) times daily. (Patient not taking: Reported on 07/26/2023)   [DISCONTINUED] Montelukast Sodium (SINGULAIR PO) Take by mouth. (Patient not taking: Reported on 07/26/2023)   No facility-administered encounter medications on file as of 07/26/2023.    Past Medical History:  Diagnosis Date   Asthma     Past Surgical History:  Procedure Laterality Date   left knee surgery     TONSILLECTOMY      History reviewed. No pertinent family history.  Social History   Socioeconomic History   Marital status: Married    Spouse name: Not on file   Number of children: Not on file   Years of education: Not on file   Highest education level: Not on file  Occupational History   Not on file  Tobacco Use   Smoking status: Never  Smokeless tobacco: Never  Substance and Sexual Activity   Alcohol use: Yes    Comment: rare   Drug use: No   Sexual activity: Not on file  Other Topics Concern   Not on file  Social History Narrative   Not on file   Social Drivers of Health   Financial Resource Strain: Low Risk  (12/18/2020)    Received from [facility]   Overall Financial Resource Strain (CARDIA)    Difficulty of Paying Living Expenses: Not hard at all  Food Insecurity: No Food Insecurity (09/23/2020)   Received from [facility]   Hunger Vital Sign    Worried About Running Out of Food in the Last Year: Never true    Ran Out of Food in the Last Year: Never true  Transportation Needs: No Transportation Needs (12/18/2020)   Received from [facility] - Transportation    Lack of Transportation (Medical): No    Lack of Transportation (Non-Medical): No  Physical Activity: Sufficiently Active (12/18/2020)   Received from [facility]   Exercise Vital Sign    Days of Exercise per Week: 5 days    Minutes of Exercise per Session: 60 min  Stress: Stress Concern Present (12/18/2020)   Received from [facility] - Occupational Stress Questionnaire    Feeling of Stress : To some extent  Social Connections: Unknown (12/08/2021)   Received from [facility]   Social Network    Social Network: Not on file  Intimate Partner Violence: Unknown (11/04/2021)   Received from [facility]   HITS    Physically Hurt: Not on file    Insult or Talk Down To: Not on file    Threaten Physical Harm: Not on file    Scream or Curse: Not on file    Review of Systems  Constitutional:  Negative for chills, fever and malaise/fatigue.  HENT:  Negative for ear pain and sore throat.   Respiratory:  Negative for cough, shortness of breath and wheezing.   Cardiovascular:  Negative for chest pain.  Gastrointestinal:  Negative for constipation, diarrhea, nausea and vomiting.  Genitourinary:  Negative for urgency.  Skin:  Negative for itching and rash.  Neurological:  Negative for dizziness and tingling.  Endo/Heme/Allergies:  Positive for polydipsia. Negative for environmental allergies. Does not bruise/bleed easily.  Psychiatric/Behavioral:  Negative for suicidal ideas. The patient  has insomnia.    Negative unless indicated in HPI    Objective   BP 109/70   Pulse 70   Temp 98 F (36.7 C) (Temporal)   Ht 6' (1.829 m)   Wt 199 lb (90.3 kg)   SpO2 98%   BMI 26.99 kg/m   Physical Exam        Assessment & Plan:  Mild intermittent asthma without complication  Encounter for general adult medical examination with abnormal findings  Polydipsia   Continue healthy lifestyle choices, including diet (rich in fruits, vegetables, and lean proteins, and low in salt and simple carbohydrates) and exercise (at least 30 minutes of moderate physical activity daily).     The above assessment and management plan was discussed with the patient. The patient verbalized understanding of and has agreed to the management plan. Patient is aware to call the clinic if they develop any new symptoms or if symptoms persist or worsen. Patient is aware when to return to the clinic for a follow-up visit. Patient educated on when it is appropriate to go  to the emergency department.  No follow-ups on file.   Arrie Aran Santa Lighter, [place] [facility] [address] [place], [place] [address] [phone]  Note: This document was prepared by [employer]ubin Milan voice dictation technology and any errors that results from this process are unintentional.

## 2023-07-26 ENCOUNTER — Encounter: Payer: Self-pay | Admitting: Nurse Practitioner

## 2023-07-26 ENCOUNTER — Other Ambulatory Visit: Payer: Self-pay | Admitting: Nurse Practitioner

## 2023-07-26 ENCOUNTER — Ambulatory Visit (INDEPENDENT_AMBULATORY_CARE_PROVIDER_SITE_OTHER): Payer: 59 | Admitting: Nurse Practitioner

## 2023-07-26 VITALS — BP 109/70 | HR 70 | Temp 98.0°F | Ht 72.0 in | Wt 199.0 lb

## 2023-07-26 DIAGNOSIS — R631 Polydipsia: Secondary | ICD-10-CM | POA: Diagnosis not present

## 2023-07-26 DIAGNOSIS — J4520 Mild intermittent asthma, uncomplicated: Secondary | ICD-10-CM

## 2023-07-26 DIAGNOSIS — Z125 Encounter for screening for malignant neoplasm of prostate: Secondary | ICD-10-CM

## 2023-07-26 DIAGNOSIS — Z0001 Encounter for general adult medical examination with abnormal findings: Secondary | ICD-10-CM

## 2023-07-26 DIAGNOSIS — J3089 Other allergic rhinitis: Secondary | ICD-10-CM

## 2023-07-26 LAB — BAYER DCA HB A1C WAIVED: HB A1C (BAYER DCA - WAIVED): 5.2 % (ref 4.8–5.6)

## 2023-07-26 MED ORDER — FEXOFENADINE HCL 180 MG PO TABS: 180.0000 mg | ORAL_TABLET | Freq: Every day | ORAL | 2 refills | Status: AC

## 2023-07-26 MED ORDER — ALBUTEROL SULFATE HFA 108 (90 BASE) MCG/ACT IN AERS: 2.0000 | INHALATION_SPRAY | Freq: Four times a day (QID) | RESPIRATORY_TRACT | 2 refills | Status: AC | PRN

## 2023-07-27 LAB — CBC WITH DIFFERENTIAL/PLATELET
Basophils Absolute: 0 10*3/uL (ref 0.0–0.2)
Basos: 1 %
EOS (ABSOLUTE): 0.1 10*3/uL (ref 0.0–0.4)
Eos: 1 %
Hematocrit: 45.2 % (ref 37.5–51.0)
Hemoglobin: 14.9 g/dL (ref 13.0–17.7)
Immature Grans (Abs): 0 10*3/uL (ref 0.0–0.1)
Immature Granulocytes: 0 %
Lymphocytes Absolute: 2.9 10*3/uL (ref 0.7–3.1)
Lymphs: 39 %
MCH: 29.6 pg (ref 26.6–33.0)
MCHC: 33 g/dL (ref 31.5–35.7)
MCV: 90 fL (ref 79–97)
Monocytes Absolute: 0.7 10*3/uL (ref 0.1–0.9)
Monocytes: 9 %
Neutrophils Absolute: 3.9 10*3/uL (ref 1.4–7.0)
Neutrophils: 50 %
Platelets: 268 10*3/uL (ref 150–450)
RBC: 5.03 x10E6/uL (ref 4.14–5.80)
RDW: 12.5 % (ref 11.6–15.4)
WBC: 7.6 10*3/uL (ref 3.4–10.8)

## 2023-07-27 LAB — CMP14+EGFR
ALT: 20 [IU]/L (ref 0–44)
AST: 17 [IU]/L (ref 0–40)
Albumin: 4.4 g/dL (ref 4.1–5.1)
Alkaline Phosphatase: 83 [IU]/L (ref 44–121)
BUN/Creatinine Ratio: 12 (ref 9–20)
BUN: 13 mg/dL (ref 6–24)
Bilirubin Total: 0.6 mg/dL (ref 0.0–1.2)
CO2: 24 mmol/L (ref 20–29)
Calcium: 9.8 mg/dL (ref 8.7–10.2)
Chloride: 105 mmol/L (ref 96–106)
Creatinine, Ser: 1.1 mg/dL (ref 0.76–1.27)
Globulin, Total: 2.1 g/dL (ref 1.5–4.5)
Glucose: 65 mg/dL — ABNORMAL LOW (ref 70–99)
Potassium: 4.5 mmol/L (ref 3.5–5.2)
Sodium: 141 mmol/L (ref 134–144)
Total Protein: 6.5 g/dL (ref 6.0–8.5)
eGFR: 85 mL/min/{1.73_m2} (ref >59)

## 2023-07-27 LAB — LIPID PANEL
Chol/HDL Ratio: 3.6 {ratio} (ref 0.0–5.0)
Cholesterol, Total: 192 mg/dL (ref 100–199)
HDL: 54 mg/dL (ref >39)
LDL Chol Calc (NIH): 110 mg/dL — ABNORMAL HIGH (ref 0–99)
Triglycerides: 158 mg/dL — ABNORMAL HIGH (ref 0–149)
VLDL Cholesterol Cal: 28 mg/dL (ref 5–40)

## 2023-07-27 LAB — THYROID PANEL WITH TSH
Free Thyroxine Index: 1.7 (ref 1.2–4.9)
T3 Uptake Ratio: 27 % (ref 24–39)
T4, Total: 6.4 ug/dL (ref 4.5–12.0)
TSH: 2.07 u[IU]/mL (ref 0.450–4.500)

## 2023-07-27 LAB — PSA, TOTAL AND FREE
PSA, Free Pct: 42 %
PSA, Free: 0.42 ng/mL
Prostate Specific Ag, Serum: 1 ng/mL (ref 0.0–4.0)

## 2023-07-28 DIAGNOSIS — J3089 Other allergic rhinitis: Secondary | ICD-10-CM

## 2023-08-12 ENCOUNTER — Encounter (INDEPENDENT_AMBULATORY_CARE_PROVIDER_SITE_OTHER): Payer: Self-pay | Admitting: Otolaryngology

## 2023-09-20 ENCOUNTER — Telehealth (INDEPENDENT_AMBULATORY_CARE_PROVIDER_SITE_OTHER): Payer: Self-pay | Admitting: Otolaryngology

## 2023-09-20 NOTE — Telephone Encounter (Signed)
 REMINDER CALL: Date: 09/21/2023 Status: Sch  Time: 1:00 PM [address] [place], [place] [address]  TEXT YES-09/20/2023 8:51 AM

## 2023-09-21 ENCOUNTER — Encounter (INDEPENDENT_AMBULATORY_CARE_PROVIDER_SITE_OTHER): Payer: Self-pay

## 2023-09-21 ENCOUNTER — Ambulatory Visit (INDEPENDENT_AMBULATORY_CARE_PROVIDER_SITE_OTHER): Payer: No Typology Code available for payment source | Admitting: Otolaryngology

## 2023-09-21 VITALS — BP 125/87 | HR 84 | Ht 72.0 in | Wt 200.0 lb

## 2023-09-21 DIAGNOSIS — J0181 Other acute recurrent sinusitis: Secondary | ICD-10-CM

## 2023-09-21 DIAGNOSIS — R0981 Nasal congestion: Secondary | ICD-10-CM

## 2023-09-21 DIAGNOSIS — J342 Deviated nasal septum: Secondary | ICD-10-CM

## 2023-09-21 DIAGNOSIS — H6993 Unspecified Eustachian tube disorder, bilateral: Secondary | ICD-10-CM | POA: Diagnosis not present

## 2023-09-21 DIAGNOSIS — H938X3 Other specified disorders of ear, bilateral: Secondary | ICD-10-CM | POA: Diagnosis not present

## 2023-09-21 MED ORDER — FLUTICASONE PROPIONATE 50 MCG/ACT NA SUSP: 2.0000 | Freq: Two times a day (BID) | NASAL | 6 refills | Status: AC

## 2023-09-21 MED ORDER — AZELASTINE HCL 0.1 % NA SOLN: 2.0000 | Freq: Two times a day (BID) | NASAL | 12 refills | Status: AC

## 2023-09-21 NOTE — Patient Instructions (Signed)
 Use flonase and astelin nasal sprays -- each spray use two squirts two times a day back to back. Continue over the counter allergy medication

## 2023-09-21 NOTE — Progress Notes (Signed)
 Dear Dr. Evern Bio, Here is my assessment for our mutual patient, James Mejia. Thank you for allowing me the opportunity to care for your patient. Please do not hesitate to contact me should you have any other questions. Sincerely, Dr. Jovita Kussmaul  Otolaryngology Clinic Note Referring provider: Dr. Evern Bio HPI:  James Mejia is a 43 y.o. male kindly referred by Dr. Evern Bio for evaluation of ear complaints and sinus complaints.  Patient reports: He reports that he has had issues with his ears for years on end. He reports that his primary complaint is ear pressure (left > right) -- "like cotton in the ear." He reports that his ears are not painful, but does say he gets ear infections fairly frequent (not sure if it is - usually gets augmentin). Denies drainage in the ears. 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 Some popping/crackling, no weather-front/pressure changes. Some hearing loss but this is subjective and chronic. He did see Dr. Marene Lenz for this 2 years ago with normal audio He does have nasal symptoms (better in summer but perennial) -- nasal congestion, some facial pressure. He denies anterior rhinorrhea or PND. Smell is ok. He does not get frequent infections from his sinuses requiring abx/steroids. He uses flonase, and tried ryaltris, and he is on cetirizine. Ryaltris and cetirizine helped, including with ear symptoms. Patient denies: vertigo Patient also denies barotrauma, vestibular suppressant use, ototoxic medication use Prior ear surgery: no Some noise exposure at work.  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 Lives in [place], [place]  Independent Review of Additional Tests or Records:  Dr. Marene Lenz ([facility]) 03/19/2022: noted bilateral tinnitus, for 1 year. Some  difficulty hearing in background noise situations; no ear surgery Audiogram reviewed (2023):  TSH and CBC and CMP 07/26/2023: wnl; Cr 1.1/BUN 13 Dr. Evern Bio (Referring Doctor): noted polydipsia, and allergic rhinitis - congestion, PND, sneezing; concern for ETD given ear pressure; Dx: AR; Rx: Ref ENT PMH/Meds/All/SocHx/FamHx/ROS:   Past Medical History:  Diagnosis Date   Asthma      Past Surgical History:  Procedure Laterality Date   left knee surgery     TONSILLECTOMY      History reviewed. No pertinent family history.   Social Connections: Unknown (12/08/2021)   Received from [facility]   Social Network    Social Network: Not on file      Current Outpatient Medications:    albuterol (VENTOLIN HFA) 108 (90 Base) MCG/ACT inhaler, Inhale 2 puffs into the lungs Mejia 6 (six) hours as needed for wheezing or shortness of breath., Disp: 8 g, Rfl: 2   azelastine (ASTELIN) 0.1 % nasal spray, Place 2 sprays into both nostrils 2 (two) times daily. Use in each nostril as directed, Disp: 30 mL, Rfl: 12   fexofenadine (ALLEGRA ALLERGY) 180 MG tablet, Take 1 tablet (180 mg total) by mouth daily., Disp: 90 tablet, Rfl: 2   fluticasone (FLONASE) 50 MCG/ACT nasal spray, Place 2 sprays into both nostrils in the morning and at bedtime., Disp: 16 g, Rfl: 6   Physical Exam:   BP 125/87 (BP Location: Right Arm, Cuff Size: Normal)   Pulse 84   Ht 6' (1.829 m)   Wt 200 lb (90.7 kg)   BMI 27.12 kg/m   Salient findings:  CN II-XII intact  Bilateral EAC clear and TM intact with well pneumatized middle ear spaces; good mobility on pneumatic otoscopy Weber 512: mid Rinne 512: AC > BC b/l  Anterior rhinoscopy: Septum intact; bilateral inferior turbinates without significant hypertrophy; Nasal endoscopy was indicated to better evaluate the nose and paranasal sinuses, given the patient's history and exam findings, and is detailed below. No lesions of oral cavity/oropharynx; dentition fair;  mild b/l TMJ crepitus No obviously palpable neck masses/lymphadenopathy/thyromegaly No respiratory distress or stridor  Seprately Identifiable Procedures:  PROCEDURE: Bilateral Diagnostic Rigid Nasal Endoscopy Pre-procedure diagnosis: Eustachian tube dysfunction, nasal congestion, history of sinusitis Post-procedure diagnosis: same Indication: See pre-procedure diagnosis and physical exam above Complications: None apparent EBL: 0 mL Anesthesia: Lidocaine 4% and topical decongestant was topically sprayed in each nasal cavity  Description of Procedure:  Patient was identified. A rigid 30 degree endoscope was utilized to evaluate the sinonasal cavities, mucosa, sinus ostia and turbinates and septum.  Overall, signs of mucosal inflammation are noted. Right septal deviation with large spur No mucopurulence, polyps, or masses noted.   Right Middle meatus: clear Right SE Recess: clear Left MM: clear Left SE Recess: clear No masses over ETD  Photodocumentation was obtained.  CPT CODE -- [address] - Mod 25   Impression & Plans:  James Mejia is a 43 y.o. male with:  1. Dysfunction of both eustachian tubes   2. Sensation of fullness in both ears   3. Nasal septal deviation   4. Other acute recurrent sinusitis   5. Nasal congestion    Multiple issues but primarily some ear fullness. No evidence of effusion, and sx responding to allergy regimen. Nasal endoscopy reassuring; he does have h/o sinusitis, but recovered and not significantly frequently. We discussed his options, and DDX including allergy contributions, ETD; we discussed possible CT or MRI for headaches and repeat audio but he wished to forego - Start astelin BID; start flonase BID - Continue OTC antihistamine - will follow up PRN, certainly if sx persist or worsen   See below regarding exact medications prescribed this encounter including dosages and route: Meds ordered this encounter  Medications   azelastine (ASTELIN) 0.1 %  nasal spray    Sig: Place 2 sprays into both nostrils 2 (two) times daily. Use in each nostril as directed    Dispense:  30 mL    Refill:  12   fluticasone (FLONASE) 50 MCG/ACT nasal spray    Sig: Place 2 sprays into both nostrils in the morning and at bedtime.    Dispense:  16 g    Refill:  6      Thank you for allowing me the opportunity to care for your patient. Please do not hesitate to contact me should you have any other questions.  Sincerely, Jovita Kussmaul, MD Otolaryngologist (ENT), [facility] Phone: [phone] Fax: [phone]  09/21/2023, 1:39 PM   MDM:  Level 4 Complexity/Problems addressed: mod - multiple chronic problems Data complexity: mod - independent review of notes, labs - Morbidity: mod  - Prescription Drug prescribed or managed: yes
